# Patient Record
Sex: Male | Born: 1995 | Race: Black or African American | Hispanic: No | Marital: Single | Smoking: Current some day smoker
Health system: Southern US, Community
[De-identification: ages and names within clinical notes are randomized; demographics above are authoritative.]

---
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
## 2020-10-31 ENCOUNTER — Ambulatory Visit (HOSPITAL_COMMUNITY)
Admission: EM | Admit: 2020-10-31 | Discharge: 2020-10-31 | Disposition: A | Discharge status: Home / Self Care | Payer: Self-pay | Attending: Student | Admitting: Student

## 2020-10-31 ENCOUNTER — Other Ambulatory Visit: Payer: Self-pay

## 2020-10-31 ENCOUNTER — Encounter (HOSPITAL_COMMUNITY): Payer: Self-pay | Admitting: Emergency Medicine

## 2020-10-31 DIAGNOSIS — H00014 Hordeolum externum left upper eyelid: Secondary | ICD-10-CM

## 2020-10-31 MED ORDER — ERYTHROMYCIN 5 MG/GM OP OINT: TOPICAL_OINTMENT | OPHTHALMIC | 0 refills | Status: AC

## 2020-10-31 NOTE — ED Triage Notes (Addendum)
Reports feeling like something in left eye.  Eye is red, eyelids puffy.  Patient feels like it is irritated.  Intermittent blurry vision

## 2020-10-31 NOTE — ED Provider Notes (Signed)
[facility]    CSN: [id number] Arrival date & time: 10/31/20  1005      History   Chief Complaint Chief Complaint  Patient presents with   Eye Problem    HPI Daniel Lester is a 25 y.o. male presenting with L upper eyelid pain and swelling x2 days. History styes in the past, but typically on the bottom. Scant clear discharge in the morning. Doesn't wear contacts or glasses.  Denies photophobia, foreign body sensation, eye redness, eye pain, eye pain with movement, injury to eye, vision changes, double vision, excessive tearing, burning eyes   HPI  History reviewed. No pertinent past medical history.  There are no problems to display for this patient.   History reviewed. No pertinent surgical history.     Home Medications    Prior to Admission medications   Medication Sig Start Date End Date Taking? Authorizing Provider  erythromycin ophthalmic ointment Place a 1/2 inch ribbon of ointment into the lower eyelid at bedtime x7 days 10/31/20  Yes Rhys Martini, PA-C    Family History Family History  Problem Relation Age of Onset   Healthy Mother    Healthy Father     Social History Social History   Tobacco Use   Smoking status: Some Days    Types: Cigarettes   Smokeless tobacco: Never  Vaping Use   Vaping Use: Never used  Substance Use Topics   Alcohol use: Never   Drug use: Yes    Types: Marijuana     Allergies   Patient has no known allergies.   Review of Systems Review of Systems  Eyes:  Positive for discharge. Negative for photophobia, pain, redness, itching and visual disturbance.  All other systems reviewed and are negative.   Physical Exam Triage Vital Signs ED Triage Vitals  Enc Vitals Group     BP 10/31/20 1023 125/75     Pulse Rate 10/31/20 1023 78     Resp 10/31/20 1023 18     Temp 10/31/20 1023 98.6 F (37 C)     Temp Source 10/31/20 1023 Oral     SpO2 10/31/20 1023 99 %     Weight --      Height --      Head  Circumference --      Peak Flow --      Pain Score 10/31/20 1020 5     Pain Loc --      Pain Edu? --      Excl. in GC? --    No data found.  Updated Vital Signs BP 125/75 (BP Location: Right Arm)   Pulse 78   Temp 98.6 F (37 C) (Oral)   Resp 18   SpO2 99%   Visual Acuity Right Eye Distance: 20/20 Left Eye Distance: 20/20 Bilateral Distance: 20/20  Right Eye Near:   Left Eye Near:    Bilateral Near:     Physical Exam Vitals reviewed.  Constitutional:      Appearance: Normal appearance.  HENT:     Head: Normocephalic and atraumatic.     Right Ear: Tympanic membrane, ear canal and external ear normal. There is no impacted cerumen.     Left Ear: Tympanic membrane, ear canal and external ear normal. There is no impacted cerumen.     Nose: Nose normal. No congestion.     Mouth/Throat:     Pharynx: Oropharynx is clear. No posterior oropharyngeal erythema.  Eyes:     General: Lids  are everted, no foreign bodies appreciated. Vision grossly intact. Gaze aligned appropriately. No visual field deficit.       Right eye: No foreign body, discharge or hordeolum.        Left eye: No foreign body, discharge or hordeolum.     Extraocular Movements: Extraocular movements intact.     Right eye: Normal extraocular motion and no nystagmus.     Left eye: Normal extraocular motion and no nystagmus.     Conjunctiva/sclera: Conjunctivae normal.     Right eye: Right conjunctiva is not injected. No chemosis, exudate or hemorrhage.    Left eye: Left conjunctiva is not injected. No chemosis, exudate or hemorrhage.    Pupils: Pupils are equal, round, and reactive to light.     Visual Fields: Right eye visual fields normal and left eye visual fields normal.     Comments: Hordeolum left upper inner lid, minimally tender. No erythema warmth discharge. PERRLA, EOMI without pain. No orbital tenderness. Visual acuity intact.   Cardiovascular:     Rate and Rhythm: Normal rate and regular rhythm.      Heart sounds: Normal heart sounds.  Pulmonary:     Effort: Pulmonary effort is normal.     Breath sounds: Normal breath sounds.  Neurological:     General: No focal deficit present.     Mental Status: He is alert.  Psychiatric:        Mood and Affect: Mood normal.        Behavior: Behavior normal.        Thought Content: Thought content normal.        Judgment: Judgment normal.     UC Treatments / Results  Labs (all labs ordered are listed, but only abnormal results are displayed) Labs Reviewed - No data to display  EKG   Radiology No results found.  Procedures Procedures (including critical care time)  Medications Ordered in UC Medications - No data to display  Initial Impression / Assessment and Plan / UC Course  I have reviewed the triage vital signs and the nursing notes.  Pertinent labs & imaging results that were available during my care of the patient were reviewed by me and considered in my medical decision making (see chart for details).     This patient is a very pleasant 25 y.o. year old male presenting with hordeolum L upper lid. Visual acuity intact. Erythromycin ointment, warm compresses. ED return precautions discussed. Patient verbalizes understanding and agreement.  .   Final Clinical Impressions(s) / UC Diagnoses   Final diagnoses:  Hordeolum externum left upper eyelid     Discharge Instructions      -You have a stye (hordeolum). This is an inflamed oil gland noted on the margin of the eyelid at the level of the eyelashes.  -We are treating it with an antibiotic ointment called erythromycin.  Use this once nightly for about 7 days.  Pull down the lower eyelid, and place about half an inch inside.  This will be messy, so press the remaining ointment around the eye.  You can wash your face with gentle soap and water in the morning to wash off any remaining ointment. -Warm compresses -Seek additional medical attention if symptoms get worse, like  eye pain, eye lid swelling, vision changes. Follow-up with your eye doctor if possible, but we're also happy to see you!      ED Prescriptions     Medication Sig Dispense Auth. Provider   erythromycin ophthalmic ointment  Place a 1/2 inch ribbon of ointment into the lower eyelid at bedtime x7 days 3.5 g Rhys Martini, PA-C      PDMP not reviewed this encounter.   Rhys Martini, PA-C 10/31/20 1134

## 2020-10-31 NOTE — Discharge Instructions (Addendum)
-  You have a stye (hordeolum). This is an inflamed oil gland noted on the margin of the eyelid at the level of the eyelashes.  -We are treating it with an antibiotic ointment called erythromycin.  Use this once nightly for about 7 days.  Pull down the lower eyelid, and place about half an inch inside.  This will be messy, so press the remaining ointment around the eye.  You can wash your face with gentle soap and water in the morning to wash off any remaining ointment. -Warm compresses -Seek additional medical attention if symptoms get worse, like eye pain, eye lid swelling, vision changes. Follow-up with your eye doctor if possible, but we're also happy to see you! 

## 2021-02-20 ENCOUNTER — Other Ambulatory Visit: Payer: Self-pay

## 2021-02-20 ENCOUNTER — Emergency Department (HOSPITAL_COMMUNITY): Payer: BC Managed Care – PPO

## 2021-02-20 ENCOUNTER — Emergency Department (HOSPITAL_COMMUNITY)
Admission: EM | Admit: 2021-02-20 | Discharge: 2021-02-20 | Disposition: A | Discharge status: Home / Self Care | Payer: BC Managed Care – PPO | Attending: Emergency Medicine | Admitting: Emergency Medicine

## 2021-02-20 DIAGNOSIS — K6289 Other specified diseases of anus and rectum: Secondary | ICD-10-CM | POA: Diagnosis present

## 2021-02-20 DIAGNOSIS — Z20822 Contact with and (suspected) exposure to covid-19: Secondary | ICD-10-CM | POA: Diagnosis not present

## 2021-02-20 DIAGNOSIS — Z79899 Other long term (current) drug therapy: Secondary | ICD-10-CM | POA: Diagnosis not present

## 2021-02-20 DIAGNOSIS — F17210 Nicotine dependence, cigarettes, uncomplicated: Secondary | ICD-10-CM | POA: Diagnosis not present

## 2021-02-20 DIAGNOSIS — K5641 Fecal impaction: Secondary | ICD-10-CM | POA: Insufficient documentation

## 2021-02-20 DIAGNOSIS — K5900 Constipation, unspecified: Secondary | ICD-10-CM

## 2021-02-20 LAB — CBC WITH DIFFERENTIAL/PLATELET
Abs Immature Granulocytes: 0.1 10*3/uL — ABNORMAL HIGH (ref 0.00–0.07)
Basophils Absolute: 0 10*3/uL (ref 0.0–0.1)
Basophils Relative: 0 %
Eosinophils Absolute: 0 10*3/uL (ref 0.0–0.5)
Eosinophils Relative: 0 %
HCT: 49.9 % (ref 39.0–52.0)
Hemoglobin: 16.2 g/dL (ref 13.0–17.0)
Immature Granulocytes: 1 %
Lymphocytes Relative: 5 %
Lymphs Abs: 1 10*3/uL (ref 0.7–4.0)
MCH: 27.8 pg (ref 26.0–34.0)
MCHC: 32.5 g/dL (ref 30.0–36.0)
MCV: 85.7 fL (ref 80.0–100.0)
Monocytes Absolute: 0.6 10*3/uL (ref 0.1–1.0)
Monocytes Relative: 3 %
Neutro Abs: 18.6 10*3/uL — ABNORMAL HIGH (ref 1.7–7.7)
Neutrophils Relative %: 91 %
Platelets: 243 10*3/uL (ref 150–400)
RBC: 5.82 MIL/uL — ABNORMAL HIGH (ref 4.22–5.81)
RDW: 13.2 % (ref 11.5–15.5)
WBC: 20.4 10*3/uL — ABNORMAL HIGH (ref 4.0–10.5)
nRBC: 0 % (ref 0.0–0.2)

## 2021-02-20 LAB — BASIC METABOLIC PANEL
Anion gap: 12 (ref 5–15)
BUN: 11 mg/dL (ref 6–20)
CO2: 22 mmol/L (ref 22–32)
Calcium: 10 mg/dL (ref 8.9–10.3)
Chloride: 106 mmol/L (ref 98–111)
Creatinine, Ser: 0.8 mg/dL (ref 0.61–1.24)
GFR, Estimated: >60 mL/min (ref >60)
Glucose, Bld: 127 mg/dL — ABNORMAL HIGH (ref 70–99)
Potassium: 3.7 mmol/L (ref 3.5–5.1)
Sodium: 140 mmol/L (ref 135–145)

## 2021-02-20 LAB — LIPASE, BLOOD: Lipase: 24 U/L (ref 11–51)

## 2021-02-20 LAB — HEPATIC FUNCTION PANEL
ALT: 24 U/L (ref 0–44)
AST: 26 U/L (ref 15–41)
Albumin: 5 g/dL (ref 3.5–5.0)
Alkaline Phosphatase: 28 U/L — ABNORMAL LOW (ref 38–126)
Bilirubin, Direct: 0.1 mg/dL (ref 0.0–0.2)
Indirect Bilirubin: 0.6 mg/dL (ref 0.3–0.9)
Total Bilirubin: 0.7 mg/dL (ref 0.3–1.2)
Total Protein: 8.1 g/dL (ref 6.5–8.1)

## 2021-02-20 LAB — CBG MONITORING, ED: Glucose-Capillary: 119 mg/dL — ABNORMAL HIGH (ref 70–99)

## 2021-02-20 LAB — LACTIC ACID, PLASMA
Lactic Acid, Venous: 2.2 mmol/L (ref 0.5–1.9)
Lactic Acid, Venous: 1.5 mmol/L (ref 0.5–1.9)

## 2021-02-20 LAB — RESP PANEL BY RT-PCR (FLU A&B, COVID) ARPGX2
Influenza A by PCR: NEGATIVE
Influenza B by PCR: NEGATIVE
SARS Coronavirus 2 by RT PCR: NEGATIVE

## 2021-02-20 MED ORDER — IOHEXOL 300 MG/ML  SOLN
100.0000 mL | Freq: Once | INTRAMUSCULAR | Status: AC | PRN
  Administered 2021-02-20: 100 mL via INTRAVENOUS

## 2021-02-20 MED ORDER — POLYETHYLENE GLYCOL 3350 17 GM/SCOOP PO POWD: 17.0000 g | Freq: Two times a day (BID) | ORAL | 0 refills | Status: AC

## 2021-02-20 MED ORDER — KETOROLAC TROMETHAMINE 15 MG/ML IJ SOLN
15.0000 mg | Freq: Once | INTRAMUSCULAR | Status: AC
  Administered 2021-02-20: 15 mg via INTRAVENOUS
  Filled 2021-02-20: qty 1

## 2021-02-20 MED ORDER — MAGNESIUM HYDROXIDE 400 MG/5ML PO SUSP
960.0000 mL | Freq: Once | ORAL | Status: AC
  Administered 2021-02-20: 960 mL via RECTAL
  Filled 2021-02-20: qty 473

## 2021-02-20 NOTE — ED Notes (Addendum)
Pt was able to get 1/4 of enema bag. Pt stated he can feel the blockage moving but still unable to pass through rectum. Informed pt the goal is to have the enema sit in rectum for as long as he can to soften stool.

## 2021-02-20 NOTE — ED Triage Notes (Addendum)
While taking blood work pt became diaphoretic followed by chills. HR 120s, BP 96/64. PA at bedside for reassessment. Pt noted to have tenderness to lower abdominal pain. Symptoms resolved. Increased acuity level.

## 2021-02-20 NOTE — ED Notes (Signed)
Pt stated he was able to pass his stool and feel relief.

## 2021-02-20 NOTE — ED Notes (Signed)
RN was only able to administer 1/4 of enema before pt stated he could not hold product any more. Encouraged pt to hold as long as possible to soften stool.

## 2021-02-20 NOTE — ED Notes (Signed)
Patient transported to CT 

## 2021-02-20 NOTE — ED Notes (Signed)
While trying to administer remaining enema pt was unable to continue hold product and it started to leak. Pt states he can feel it but it just won't pass through. Will notify EDP

## 2021-02-20 NOTE — ED Triage Notes (Addendum)
Pt c/o rectal pain onset yesterday. Took laxative yesterday at 11 pm, no BM since, and no improvement of pain. Endorses constipation. Denies abdominal pain, NV, and rectal bleeding.   Initial check in complain of leg pain. Denies any leg pain.

## 2021-02-20 NOTE — ED Provider Notes (Signed)
[facility] EMERGENCY DEPARTMENT Provider Note   CSN: [id number] Arrival date & time: 02/20/21  4098     History Chief Complaint  Patient presents with   Rectal Pain    Daniel Lester is a 25 y.o. male.  HPI  Patient presents ED with complaints of rectal pain and constipation that started in the last day or 2..  Patient states he has felt constipated recently.  He feels like he has to go to the bathroom but has only gone a small amount.  He still feels like there is stool in his rectum.  He tried over-the-counter medications without relief.  He is getting bouts of pain in the rectal area.  When this occurs he feels diaphoretic and sweaty.  No past medical history on file.  There are no problems to display for this patient.   No past surgical history on file.     Family History  Problem Relation Age of Onset   Healthy Mother    Healthy Father     Social History   Tobacco Use   Smoking status: Some Days    Types: Cigarettes   Smokeless tobacco: Never  Vaping Use   Vaping Use: Never used  Substance Use Topics   Alcohol use: Never   Drug use: Yes    Types: Marijuana    Home Medications Prior to Admission medications   Medication Sig Start Date End Date Taking? Authorizing Provider  polyethylene glycol powder (MIRALAX) 17 GM/SCOOP powder Take 17 g by mouth in the morning and at bedtime. 02/20/21  Yes Linwood Dibbles, MD  erythromycin ophthalmic ointment Place a 1/2 inch ribbon of ointment into the lower eyelid at bedtime x7 days 10/31/20   Rhys Martini, PA-C    Allergies    Patient has no known allergies.  Review of Systems   Review of Systems  Constitutional:  Negative for fever.  Gastrointestinal:  Negative for abdominal pain.  All other systems reviewed and are negative.  Physical Exam Updated Vital Signs BP 125/76    Pulse 78    Temp 99.2 F (37.3 C) (Oral)    Resp 20    SpO2 99%   Physical Exam Vitals and nursing note reviewed.   Constitutional:      General: He is not in acute distress.    Appearance: He is well-developed.  HENT:     Head: Normocephalic and atraumatic.     Right Ear: External ear normal.     Left Ear: External ear normal.  Eyes:     General: No scleral icterus.       Right eye: No discharge.        Left eye: No discharge.     Conjunctiva/sclera: Conjunctivae normal.  Neck:     Trachea: No tracheal deviation.  Cardiovascular:     Rate and Rhythm: Normal rate and regular rhythm.  Pulmonary:     Effort: Pulmonary effort is normal. No respiratory distress.     Breath sounds: Normal breath sounds. No stridor. No wheezing or rales.  Abdominal:     General: Bowel sounds are normal. There is no distension.     Palpations: Abdomen is soft.     Tenderness: There is no abdominal tenderness. There is no guarding or rebound.  Genitourinary:    Comments: Large firm stool ball in the rectal vault, no mass, no blood noted on exam, no areas of induration or fluctuance Musculoskeletal:        General:  No tenderness or deformity.     Cervical back: Neck supple.  Skin:    General: Skin is warm and dry.     Findings: No rash.  Neurological:     General: No focal deficit present.     Mental Status: He is alert.     Cranial Nerves: No cranial nerve deficit (no facial droop, extraocular movements intact, no slurred speech).     Sensory: No sensory deficit.     Motor: No abnormal muscle tone or seizure activity.     Coordination: Coordination normal.  Psychiatric:        Mood and Affect: Mood normal.    ED Results / Procedures / Treatments   Labs (all labs ordered are listed, but only abnormal results are displayed) Labs Reviewed  CBC WITH DIFFERENTIAL/PLATELET - Abnormal; Notable for the following components:      Result Value   WBC 20.4 (*)    RBC 5.82 (*)    Neutro Abs 18.6 (*)    Abs Immature Granulocytes 0.10 (*)    All other components within normal limits  BASIC METABOLIC PANEL -  Abnormal; Notable for the following components:   Glucose, Bld 127 (*)    All other components within normal limits  HEPATIC FUNCTION PANEL - Abnormal; Notable for the following components:   Alkaline Phosphatase 28 (*)    All other components within normal limits  LACTIC ACID, PLASMA - Abnormal; Notable for the following components:   Lactic Acid, Venous 2.2 (*)    All other components within normal limits  CBG MONITORING, ED - Abnormal; Notable for the following components:   Glucose-Capillary 119 (*)    All other components within normal limits  RESP PANEL BY RT-PCR (FLU A&B, COVID) ARPGX2  CULTURE, BLOOD (ROUTINE X 2)  CULTURE, BLOOD (ROUTINE X 2)  LIPASE, BLOOD  LACTIC ACID, PLASMA    EKG None  Radiology DG Abdomen 1 View  Result Date: 02/20/2021 CLINICAL DATA:  Constipation. EXAM: ABDOMEN - 1 VIEW COMPARISON:  5/24/6 FINDINGS: The bowel gas pattern appears nonobstructed. Moderate stool burden identified within the colon. Large desiccated stool ball is noted within the rectum with a diameter of 6.4 cm. No radiopaque foreign body or abnormal abdominal calcifications. No signs of free air. IMPRESSION: 1. Nonobstructive bowel gas pattern. 2. Moderate stool burden within the colon with desiccated stool ball in the rectum. Correlate for any clinical signs or symptoms of constipation. Electronically Signed   By: Kerby Moors M.D.   On: 02/20/2021 08:11   CT ABDOMEN PELVIS W CONTRAST  Result Date: 02/20/2021 CLINICAL DATA:  Left lower quadrant abdominal pain. EXAM: CT ABDOMEN AND PELVIS WITH CONTRAST TECHNIQUE: Multidetector CT imaging of the abdomen and pelvis was performed using the standard protocol following bolus administration of intravenous contrast. CONTRAST:  126mL OMNIPAQUE IOHEXOL 300 MG/ML  SOLN COMPARISON:  None. FINDINGS: Lower chest: No acute abnormality. Hepatobiliary: No focal liver abnormality is seen. No gallstones, gallbladder wall thickening, or biliary dilatation.  Pancreas: Unremarkable. No pancreatic ductal dilatation or surrounding inflammatory changes. Spleen: Normal in size without focal abnormality. Adrenals/Urinary Tract: Adrenal glands are unremarkable. Kidneys are normal, without renal calculi, focal lesion, or hydronephrosis. Bladder is unremarkable. Stomach/Bowel: The stomach appears normal. No small bowel dilatation is noted. Stool is noted throughout the colon, with a large amount of stool noted in the distal sigmoid colon and rectum concerning for impaction. No definite inflammation is noted. Appendix is not clearly visualized. Vascular/Lymphatic: No significant vascular findings are  present. No enlarged abdominal or pelvic lymph nodes. Reproductive: Prostate is unremarkable. Other: No abdominal wall hernia or abnormality. No abdominopelvic ascites. Musculoskeletal: No acute or significant osseous findings. IMPRESSION: Stool is noted throughout the colon, with a large amount of stool noted in the distal sigmoid colon and rectum concerning for impaction. No other abnormality seen in the abdomen or pelvis. Electronically Signed   By: Marijo Conception M.D.   On: 02/20/2021 12:48    Procedures Procedures   Medications Ordered in ED Medications  ketorolac (TORADOL) 15 MG/ML injection 15 mg (15 mg Intravenous Given 02/20/21 1240)  sorbitol, milk of mag, mineral oil, glycerin (SMOG) enema (960 mLs Rectal Given 02/20/21 1310)  iohexol (OMNIPAQUE) 300 MG/ML solution 100 mL (100 mLs Intravenous Contrast Given 02/20/21 1204)    ED Course  I have reviewed the triage vital signs and the nursing notes.  Pertinent labs & imaging results that were available during my care of the patient were reviewed by me and considered in my medical decision making (see chart for details).  Clinical Course as of 02/20/21 1531  Sat Feb 20, 2021  1131 Abdominal film shows large stool ball [JK]  1131 CBC shows leukocytosis electrolyte panel unremarkable [JK]  1342 CT scan  consistent with possible fecal impaction.  No other abnormality noted.  No signs of colitis or diverticulitis, no rectal abscess [JK]    Clinical Course User Index [JK] Dorie Rank, MD   MDM Rules/Calculators/A&P                         Patient presented with complaints of rectal pain.  Labs notable for significant leukocytosis.  Concern for the possibility of abscess versus stercoral colitis.  Patient did have fecal impaction notable on exam.  CT scan was performed and it does not show any evidence of colitis but does show fecal impaction.  Patient was given an enema and had large bowel movement in the ED with significant improvement.    Final Clinical Impression(s) / ED Diagnoses Final diagnoses:  Constipation, unspecified constipation type  Fecal impaction ([place])    Rx / DC Orders ED Discharge Orders          Ordered    polyethylene glycol powder (MIRALAX) 17 GM/SCOOP powder  2 times daily        02/20/21 1531             Dorie Rank, MD 02/20/21 [phone]

## 2021-02-20 NOTE — Discharge Instructions (Signed)
Take medication as prescribed to help keep your stool soft.  You can also use over-the-counter enemas as needed.  Follow-up with your primary care doctor to be rechecked

## 2021-02-20 NOTE — ED Provider Notes (Addendum)
Emergency Medicine Provider Triage Evaluation Note  Daniel Lester , a 25 y.o. male  was evaluated in triage.  Pt complains of rectal pain and constipation. Tried taking otc laxatives earlier today w/o relief. He further reports sweats/chills  Review of Systems  Positive: Constipation, rectal bleeding Negative: Abd pain, nv  Physical Exam  BP 119/72    Pulse 91    Temp 98.7 F (37.1 C) (Oral)    Resp 17    SpO2 99%  Gen:   Awake, no distress   Resp:  Normal effort  MSK:   Moves extremities without difficulty  Abd exam: Mild TTP to the LLQ  Medical Decision Making  Medically screening exam initiated at 6:59 AM.  Appropriate orders placed.  Vince L Seago was informed that the remainder of the evaluation will be completed by another provider, this initial triage assessment does not replace that evaluation, and the importance of remaining in the ED until their evaluation is complete.     Karrie Meres, PA-C 02/20/21 2703    Karrie Meres, PA-C 02/20/21 5009    Geoffery Lyons, MD 02/21/21 [phone]

## 2021-02-25 LAB — CULTURE, BLOOD (ROUTINE X 2)
Culture: NO GROWTH
Culture: NO GROWTH
Special Requests: ADEQUATE

## 2022-05-15 IMAGING — DX DG ABDOMEN 1V
2 series · 2 of 2 positions shown · non-contrast
Comparison: [DATE]

CLINICAL DATA: Constipation.

EXAM:
ABDOMEN - 1 VIEW

[t abdomen supine (1 of 2)]
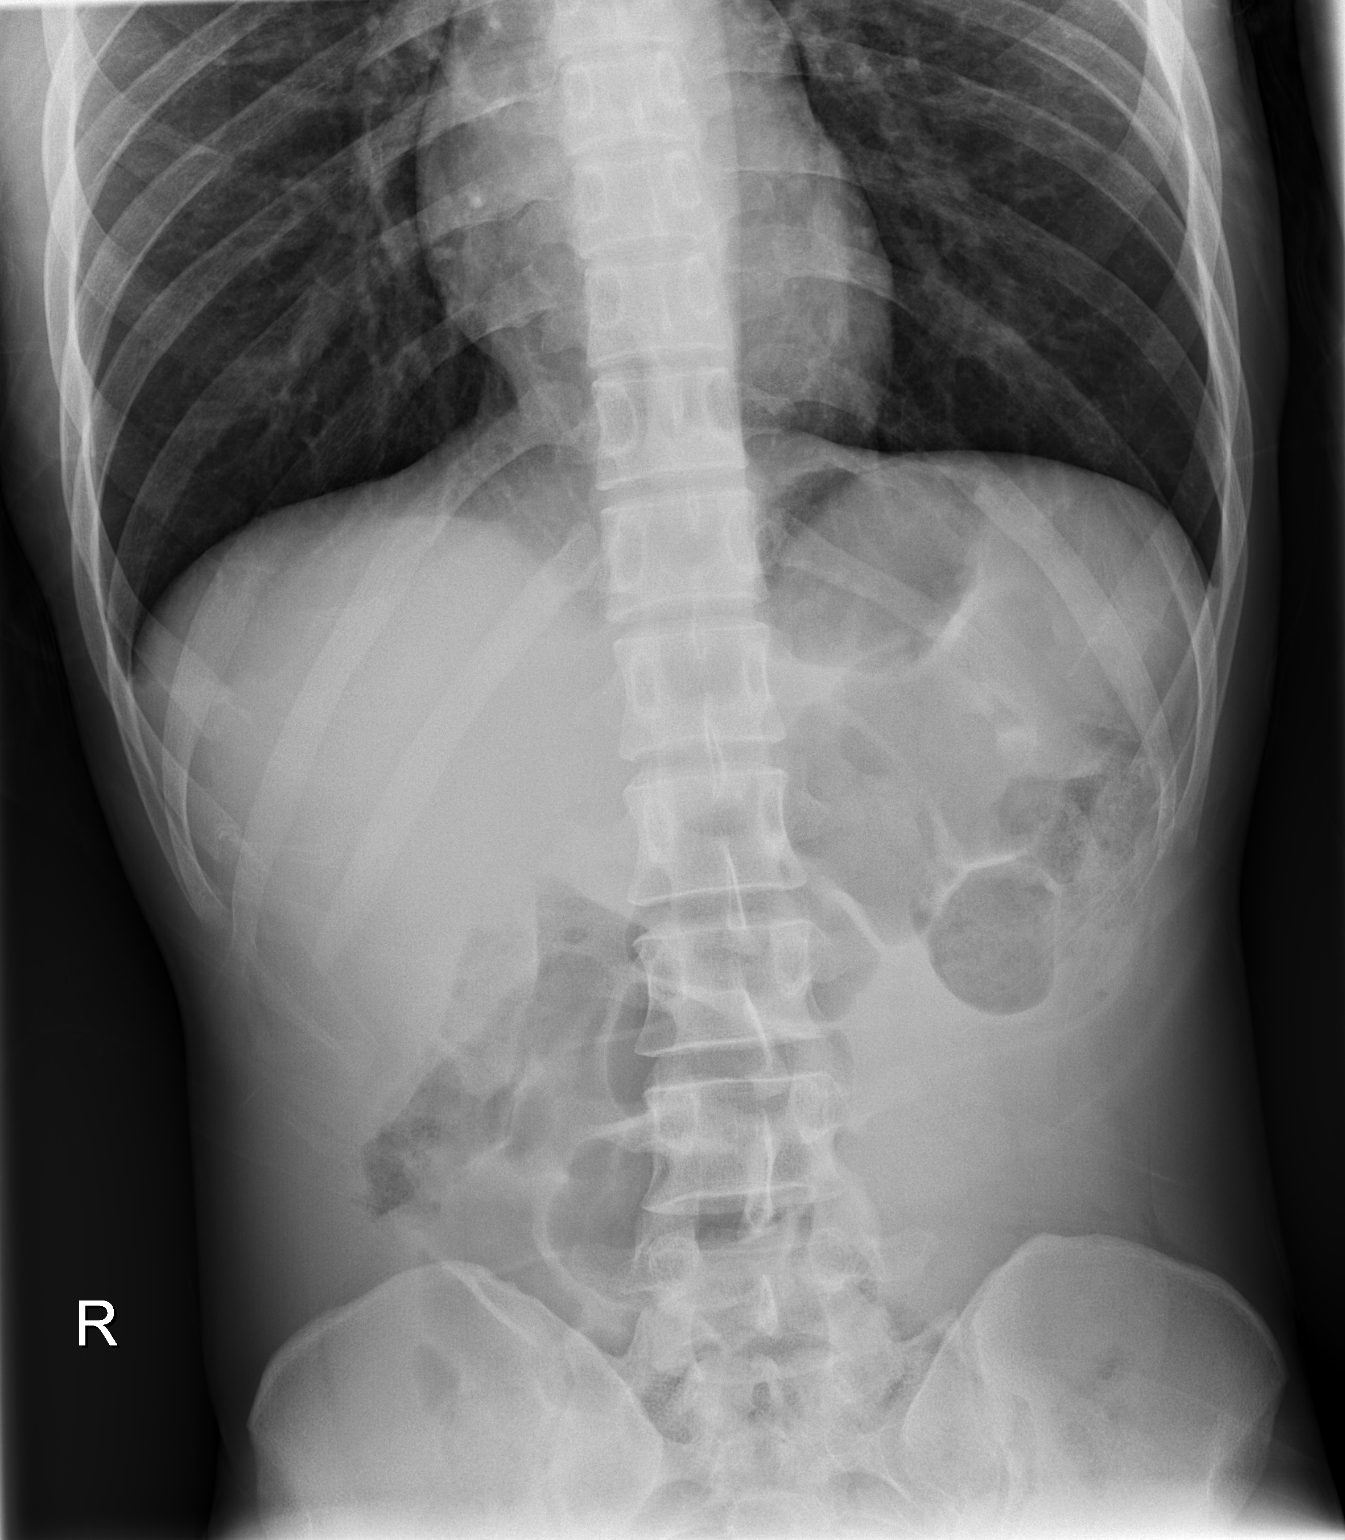

[t abdomen supine (2 of 2)]
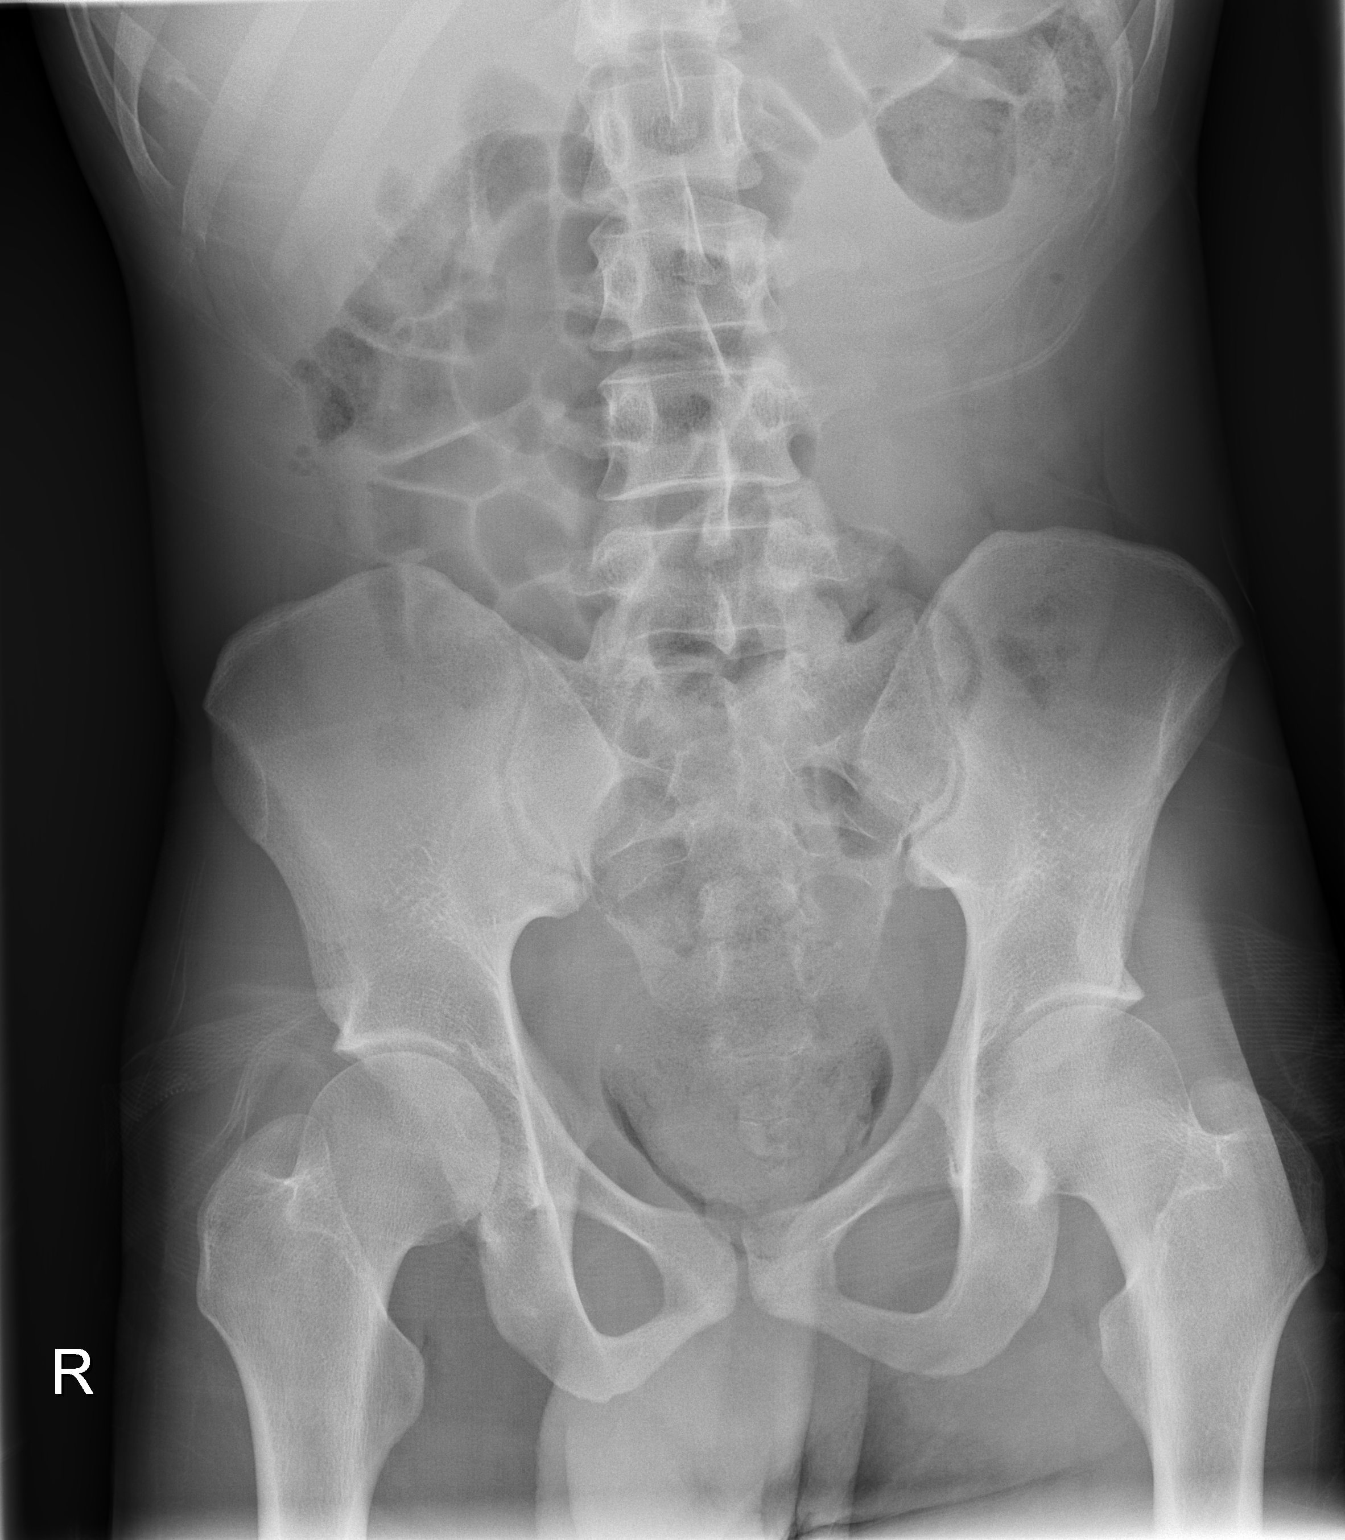

[2 of 2 positions shown; findings below may reference images not displayed]

FINDINGS: The bowel gas pattern appears nonobstructed. Moderate stool burden
identified within the colon. Large desiccated stool ball is noted
within the rectum with a diameter of 6.4 cm. No radiopaque foreign
body or abnormal abdominal calcifications. No signs of free air.
IMPRESSION: 1. Nonobstructive bowel gas pattern.
2. Moderate stool burden within the colon with desiccated stool ball
in the rectum. Correlate for any clinical signs or symptoms of
constipation.

## 2023-04-23 DIAGNOSIS — Z202 Contact with and (suspected) exposure to infections with a predominantly sexual mode of transmission: Secondary | ICD-10-CM | POA: Diagnosis not present

## 2023-04-23 DIAGNOSIS — A64 Unspecified sexually transmitted disease: Secondary | ICD-10-CM | POA: Diagnosis not present

## 2023-06-06 ENCOUNTER — Encounter: Payer: Self-pay | Admitting: Emergency Medicine

## 2023-06-06 ENCOUNTER — Ambulatory Visit
Admission: EM | Admit: 2023-06-06 | Discharge: 2023-06-06 | Disposition: A | Discharge status: Home / Self Care | Attending: Family Medicine | Admitting: Family Medicine

## 2023-06-06 DIAGNOSIS — L02213 Cutaneous abscess of chest wall: Secondary | ICD-10-CM | POA: Diagnosis not present

## 2023-06-06 MED ORDER — SULFAMETHOXAZOLE-TRIMETHOPRIM 800-160 MG PO TABS: 1.0000 | ORAL_TABLET | Freq: Two times a day (BID) | ORAL | 0 refills | Status: AC

## 2023-06-06 NOTE — ED Provider Notes (Signed)
 [facility]    CSN: [id number] Arrival date & time: 06/06/23  0805      History   Chief Complaint Chief Complaint  Patient presents with   Abscess    HPI Daniel Lester is a 28 y.o. male.    Abscess Presents today with a concern for a abscess forming on his chest wall immediately above his left nipple.  Patient endorses that he noticed the abscess days ago and touched it and noticed it was slightly tender.  The tenderness has increased over the last few days and abscess has not resolved.  He is in for evaluation.  Has no history of recurrent abscess formation.  Abscess has not drained.  History reviewed. No pertinent past medical history.  There are no active problems to display for this patient.   History reviewed. No pertinent surgical history.     Home Medications    Prior to Admission medications   Medication Sig Start Date End Date Taking? Authorizing Provider  sulfamethoxazole-trimethoprim (BACTRIM DS) 800-160 MG tablet Take 1 tablet by mouth 2 (two) times daily. 06/06/23  Yes Bing Neighbors, NP  erythromycin ophthalmic ointment Place a 1/2 inch ribbon of ointment into the lower eyelid at bedtime x7 days 10/31/20   Rhys Martini, PA-C  polyethylene glycol powder (MIRALAX) 17 GM/SCOOP powder Take 17 g by mouth in the morning and at bedtime. 02/20/21   Linwood Dibbles, MD    Family History Family History  Problem Relation Age of Onset   Healthy Mother    Healthy Father     Social History Social History   Tobacco Use   Smoking status: Some Days    Types: Cigarettes    Passive exposure: Never   Smokeless tobacco: Never  Vaping Use   Vaping status: Never Used  Substance Use Topics   Alcohol use: Never   Drug use: Yes    Types: Marijuana     Allergies   Patient has no known allergies.   Review of Systems Review of Systems Pertinent negatives listed in HPI   Physical Exam Triage Vital Signs ED Triage Vitals  Encounter Vitals  Group     BP 06/06/23 0829 118/68     Systolic BP Percentile --      Diastolic BP Percentile --      Pulse Rate 06/06/23 0829 66     Resp 06/06/23 0829 16     Temp 06/06/23 0829 98 F (36.7 C)     Temp Source 06/06/23 0829 Oral     SpO2 06/06/23 0829 97 %     Weight 06/06/23 0827 120 lb (54.4 kg)     Height --      Head Circumference --      Peak Flow --      Pain Score 06/06/23 0827 6     Pain Loc --      Pain Education --      Exclude from Growth Chart --    No data found.  Updated Vital Signs BP 118/68 (BP Location: Left Arm)   Pulse 66   Temp 98 F (36.7 C) (Oral)   Resp 16   Wt 120 lb (54.4 kg)   SpO2 97%   Visual Acuity Right Eye Distance:   Left Eye Distance:   Bilateral Distance:    Right Eye Near:   Left Eye Near:    Bilateral Near:     Physical Exam Vitals reviewed.  Constitutional:  Appearance: Normal appearance.  HENT:     Head: Normocephalic and atraumatic.  Eyes:     Extraocular Movements: Extraocular movements intact.     Pupils: Pupils are equal, round, and reactive to light.  Cardiovascular:     Rate and Rhythm: Normal rate and regular rhythm.  Pulmonary:     Effort: Pulmonary effort is normal.     Breath sounds: Normal breath sounds.  Chest:    Neurological:     Mental Status: He is alert.      UC Treatments / Results  Labs (all labs ordered are listed, but only abnormal results are displayed) Labs Reviewed - No data to display  EKG   Radiology No results found.  Procedures Procedures (including critical care time)  Medications Ordered in UC Medications - No data to display  Initial Impression / Assessment and Plan / UC Course  I have reviewed the triage vital signs and the nursing notes.  Pertinent labs & imaging results that were available during my care of the patient were reviewed by me and considered in my medical decision making (see chart for details).    Abscess of the left chest wall, I&D not indicated  will trial treatment with antibiotics.  Bactrim twice daily for 10 days along with warm compresses.  Return precautions given if symptoms do not resolve.  Verbalized understanding and agreement with plan. Final Clinical Impressions(s) / UC Diagnoses   Final diagnoses:  Cutaneous abscess of chest wall     Discharge Instructions      Start antibiotics take twice daily for 10 days.  Also recommend applying warm compresses to the abscess swelling.  If abscess is not completely resolved within 2 weeks return for evaluation.  If abscess becomes more enlarged or painful after completing antibiotics return sooner.   ED Prescriptions     Medication Sig Dispense Auth. Provider   sulfamethoxazole-trimethoprim (BACTRIM DS) 800-160 MG tablet Take 1 tablet by mouth 2 (two) times daily. 20 tablet Buena Carmine, NP      PDMP not reviewed this encounter.   Buena Carmine, NP 06/06/23 [phone]

## 2023-06-06 NOTE — ED Triage Notes (Signed)
 Pt presents with abscess on left side of chest near his nipple. Pt says he first noticed it was there on last Sunday. Only painful to touch.

## 2023-06-06 NOTE — Discharge Instructions (Signed)
 Start antibiotics take twice daily for 10 days.  Also recommend applying warm compresses to the abscess swelling.  If abscess is not completely resolved within 2 weeks return for evaluation.  If abscess becomes more enlarged or painful after completing antibiotics return sooner.

## 2023-07-11 ENCOUNTER — Ambulatory Visit: Admitting: Family

## 2023-07-12 ENCOUNTER — Encounter: Payer: Self-pay | Admitting: Family

## 2023-07-12 ENCOUNTER — Ambulatory Visit (INDEPENDENT_AMBULATORY_CARE_PROVIDER_SITE_OTHER): Admitting: Family

## 2023-07-12 VITALS — BP 127/81 | HR 97

## 2023-07-12 DIAGNOSIS — Z0000 Encounter for general adult medical examination without abnormal findings: Secondary | ICD-10-CM

## 2023-07-12 DIAGNOSIS — Z131 Encounter for screening for diabetes mellitus: Secondary | ICD-10-CM | POA: Diagnosis not present

## 2023-07-12 DIAGNOSIS — Z130 Encounter for screening for diseases of the blood and blood-forming organs and certain disorders involving the immune mechanism: Secondary | ICD-10-CM

## 2023-07-12 DIAGNOSIS — Z13228 Encounter for screening for other metabolic disorders: Secondary | ICD-10-CM

## 2023-07-12 DIAGNOSIS — Z114 Encounter for screening for human immunodeficiency virus [HIV]: Secondary | ICD-10-CM | POA: Diagnosis not present

## 2023-07-12 DIAGNOSIS — Z1159 Encounter for screening for other viral diseases: Secondary | ICD-10-CM | POA: Diagnosis not present

## 2023-07-12 DIAGNOSIS — Z1329 Encounter for screening for other suspected endocrine disorder: Secondary | ICD-10-CM | POA: Diagnosis not present

## 2023-07-12 DIAGNOSIS — Z7689 Persons encountering health services in other specified circumstances: Secondary | ICD-10-CM

## 2023-07-12 DIAGNOSIS — Z13220 Encounter for screening for lipoid disorders: Secondary | ICD-10-CM | POA: Diagnosis not present

## 2023-07-12 NOTE — Progress Notes (Signed)
 Subjective:    Daniel Lester - 28 y.o. male MRN [id number]  Date of birth: Oct 01, 1995  HPI  Daniel Lester is to establish care and annual exam.  Current issues and/or concerns: - States feeling improved since recent Urgent Care visit. Denies red flag symptoms. - No further issues/concerns for discussion today.  ROS per HPI    Health Maintenance:  Health Maintenance Due  Topic Date Due   HPV VACCINES (1 - Male 3-dose series) Never done   HIV Screening  Never done   Hepatitis C Screening  Never done   DTaP/Tdap/Td (1 - Tdap) Never done   Pneumococcal Vaccine 79-44 Years old (1 of 2 - PCV) Never done   COVID-19 Vaccine (1 - 2024-25 season) Never done     Past Medical History: There are no active problems to display for this patient.     Social History   reports that he has been smoking cigarettes. He has never been exposed to tobacco smoke. He has never used smokeless tobacco. He reports that he does not currently use alcohol. He reports current drug use. Drug: Marijuana.   Family History  family history includes Diabetes in his maternal grandfather and maternal grandmother; Healthy in his father and mother.   Medications: reviewed and updated   Objective:   Physical Exam BP 127/81   Pulse 97   SpO2 98%   Physical Exam HENT:     Head: Normocephalic and atraumatic.     Right Ear: Tympanic membrane, ear canal and external ear normal.     Left Ear: Tympanic membrane, ear canal and external ear normal.     Nose: Nose normal.     Mouth/Throat:     Mouth: Mucous membranes are moist.     Pharynx: Oropharynx is clear.  Eyes:     Extraocular Movements: Extraocular movements intact.     Conjunctiva/sclera: Conjunctivae normal.     Pupils: Pupils are equal, round, and reactive to light.  Neck:     Thyroid: No thyroid mass, thyromegaly or thyroid tenderness.  Cardiovascular:     Rate and Rhythm: Normal rate and regular rhythm.     Pulses: Normal pulses.      Heart sounds: Normal heart sounds.  Pulmonary:     Effort: Pulmonary effort is normal.     Breath sounds: Normal breath sounds.  Abdominal:     General: Bowel sounds are normal.     Palpations: Abdomen is soft.  Genitourinary:    Comments: Patient declined. Musculoskeletal:        General: Normal range of motion.     Right shoulder: Normal.     Left shoulder: Normal.     Right upper arm: Normal.     Left upper arm: Normal.     Right elbow: Normal.     Left elbow: Normal.     Right forearm: Normal.     Left forearm: Normal.     Right wrist: Normal.     Left wrist: Normal.     Right hand: Normal.     Left hand: Normal.     Cervical back: Normal, normal range of motion and neck supple.     Thoracic back: Normal.     Lumbar back: Normal.     Right hip: Normal.     Left hip: Normal.     Right upper leg: Normal.     Left upper leg: Normal.     Right knee: Normal.     Left  knee: Normal.     Right lower leg: Normal.     Left lower leg: Normal.     Right ankle: Normal.     Left ankle: Normal.     Right foot: Normal.     Left foot: Normal.  Skin:    General: Skin is warm and dry.     Capillary Refill: Capillary refill takes less than 2 seconds.  Neurological:     General: No focal deficit present.     Mental Status: He is alert and oriented to person, place, and time.  Psychiatric:        Mood and Affect: Mood normal.        Behavior: Behavior normal.       Assessment & Plan:  1. Encounter to establish care (Primary) 2. Annual physical exam - Counseled on 150 minutes of exercise per week as tolerated, healthy eating (including decreased daily intake of saturated fats, cholesterol, added sugars, sodium), STI prevention, and routine healthcare maintenance.  3. Screening for metabolic disorder - Routine screening.  - CMP14+EGFR  4. Screening for deficiency anemia - Routine screening.  - CBC  5. Diabetes mellitus screening - Routine screening.  - Hemoglobin  A1c  6. Screening cholesterol level - Routine screening.  - Lipid panel  7. Thyroid disorder screen - Routine screening.  - TSH  8. Encounter for screening for HIV - Routine screening.  - HIV antibody (with reflex)  9. Need for hepatitis C screening test - Routine screening.  - Hepatitis C Antibody   Patient was given clear instructions to go to Emergency Department or return to medical center if symptoms don't improve, worsen, or new problems develop.The patient verbalized understanding.  I discussed the assessment and treatment plan with the patient. The patient was provided an opportunity to ask questions and all were answered. The patient agreed with the plan and demonstrated an understanding of the instructions.   The patient was advised to call back or seek an in-person evaluation if the symptoms worsen or if the condition fails to improve as anticipated.    Lavona Pounds, NP 07/12/2023, 3:04 PM Primary Care at [facility]

## 2023-07-13 LAB — CMP14+EGFR
ALT: 12 IU/L (ref 0–44)
AST: 16 IU/L (ref 0–40)
Albumin: 4.9 g/dL (ref 4.3–5.2)
Alkaline Phosphatase: 29 IU/L — ABNORMAL LOW (ref 44–121)
BUN/Creatinine Ratio: 14 (ref 9–20)
BUN: 12 mg/dL (ref 6–20)
Bilirubin Total: 0.3 mg/dL (ref 0.0–1.2)
CO2: 20 mmol/L (ref 20–29)
Calcium: 9.6 mg/dL (ref 8.7–10.2)
Chloride: 104 mmol/L (ref 96–106)
Creatinine, Ser: 0.88 mg/dL (ref 0.76–1.27)
Globulin, Total: 2.1 g/dL (ref 1.5–4.5)
Glucose: 82 mg/dL (ref 70–99)
Potassium: 4.3 mmol/L (ref 3.5–5.2)
Sodium: 140 mmol/L (ref 134–144)
Total Protein: 7 g/dL (ref 6.0–8.5)
eGFR: 122 mL/min/{1.73_m2} (ref >59)

## 2023-07-13 LAB — CBC
Hematocrit: 44.9 % (ref 37.5–51.0)
Hemoglobin: 14.7 g/dL (ref 13.0–17.7)
MCH: 28.4 pg (ref 26.6–33.0)
MCHC: 32.7 g/dL (ref 31.5–35.7)
MCV: 87 fL (ref 79–97)
Platelets: 264 10*3/uL (ref 150–450)
RBC: 5.18 x10E6/uL (ref 4.14–5.80)
RDW: 12.6 % (ref 11.6–15.4)
WBC: 11.3 10*3/uL — ABNORMAL HIGH (ref 3.4–10.8)

## 2023-07-13 LAB — HIV ANTIBODY (ROUTINE TESTING W REFLEX): HIV Screen 4th Generation wRfx: NONREACTIVE

## 2023-07-13 LAB — HEMOGLOBIN A1C
Est. average glucose Bld gHb Est-mCnc: 111 mg/dL
Hgb A1c MFr Bld: 5.5 % (ref 4.8–5.6)

## 2023-07-13 LAB — LIPID PANEL
Chol/HDL Ratio: 3 ratio (ref 0.0–5.0)
Cholesterol, Total: 187 mg/dL (ref 100–199)
HDL: 63 mg/dL (ref >39)
LDL Chol Calc (NIH): 115 mg/dL — ABNORMAL HIGH (ref 0–99)
Triglycerides: 48 mg/dL (ref 0–149)
VLDL Cholesterol Cal: 9 mg/dL (ref 5–40)

## 2023-07-13 LAB — TSH: TSH: 0.579 u[IU]/mL (ref 0.450–4.500)

## 2023-07-13 LAB — HEPATITIS C ANTIBODY: Hep C Virus Ab: NONREACTIVE

## 2023-07-18 ENCOUNTER — Ambulatory Visit: Payer: Self-pay | Admitting: Family

## 2023-07-18 DIAGNOSIS — Z13220 Encounter for screening for lipoid disorders: Secondary | ICD-10-CM

## 2023-07-18 DIAGNOSIS — Z130 Encounter for screening for diseases of the blood and blood-forming organs and certain disorders involving the immune mechanism: Secondary | ICD-10-CM

## 2023-07-20 NOTE — Telephone Encounter (Signed)
 Patient called in for results. Patient identified. Patient was given lab results and had no questions

## 2024-03-22 ENCOUNTER — Encounter: Payer: Self-pay | Admitting: Family

## 2024-03-28 ENCOUNTER — Encounter: Payer: Self-pay | Admitting: Emergency Medicine

## 2024-03-28 ENCOUNTER — Inpatient Hospital Stay: Admission: RE | Admit: 2024-03-28 | Discharge: 2024-03-28 | Payer: Self-pay

## 2024-03-28 ENCOUNTER — Ambulatory Visit
Admission: RE | Admit: 2024-03-28 | Discharge: 2024-03-28 | Disposition: A | Discharge status: Home / Self Care | Payer: Self-pay | Source: Ambulatory Visit

## 2024-03-28 DIAGNOSIS — Z711 Person with feared health complaint in whom no diagnosis is made: Secondary | ICD-10-CM

## 2024-03-28 NOTE — ED Provider Notes (Signed)
 " Daniel Lester    CSN: [id number] Arrival date & time: 03/28/24  9147      History   Chief Complaint Chief Complaint  Patient presents with   SEXUALLY TRANSMITTED DISEASE    HPI Daniel Lester is a 29 y.o. male.   Pt request STD testing.  He is not currently having any concerns.  He denies any discharge.  Patient is requesting blood and swab  The history is provided by the patient. No language interpreter was used.    History reviewed. No pertinent past medical history.  There are no active problems to display for this patient.   History reviewed. No pertinent surgical history.     Home Medications    Prior to Admission medications  Medication Sig Start Date End Date Taking? Authorizing Provider  erythromycin  ophthalmic ointment Place a 1/2 inch ribbon of ointment into the lower eyelid at bedtime x7 days Patient not taking: Reported on 07/12/2023 10/31/20   Graham, Laura E, PA-C  polyethylene glycol powder (MIRALAX ) 17 GM/SCOOP powder Take 17 g by mouth in the morning and at bedtime. Patient not taking: Reported on 07/12/2023 02/20/21   Randol Simmonds, MD  sulfamethoxazole -trimethoprim  (BACTRIM  DS) 800-160 MG tablet Take 1 tablet by mouth 2 (two) times daily. Patient not taking: Reported on 07/12/2023 06/06/23   Arloa Suzen RAMAN, NP    Family History Family History  Problem Relation Age of Onset   Healthy Mother    Healthy Father    Diabetes Maternal Grandmother    Diabetes Maternal Grandfather     Social History Social History[1]   Allergies   Patient has no known allergies.   Review of Systems Review of Systems  All other systems reviewed and are negative.    Physical Exam Triage Vital Signs ED Triage Vitals [03/28/24 0900]  Encounter Vitals Group     BP 121/76     Girls Systolic BP Percentile      Girls Diastolic BP Percentile      Boys Systolic BP Percentile      Boys Diastolic BP Percentile      Pulse Rate 91     Resp 16     Temp  97.9 F (36.6 C)     Temp Source Oral     SpO2 98 %     Weight      Height      Head Circumference      Peak Flow      Pain Score 0     Pain Loc      Pain Education      Exclude from Growth Chart    No data found.  Updated Vital Signs BP 121/76 (BP Location: Left Arm)   Pulse 91   Temp 97.9 F (36.6 C) (Oral)   Resp 16   SpO2 98%   Visual Acuity Right Eye Distance:   Left Eye Distance:   Bilateral Distance:    Right Eye Near:   Left Eye Near:    Bilateral Near:     Physical Exam Vitals and nursing note reviewed.  Constitutional:      Appearance: He is well-developed.  HENT:     Head: Normocephalic.  Cardiovascular:     Rate and Rhythm: Normal rate.  Pulmonary:     Effort: Pulmonary effort is normal.  Abdominal:     General: There is no distension.  Musculoskeletal:        General: Normal range of motion.  Cervical back: Normal range of motion.  Skin:    General: Skin is warm.  Neurological:     General: No focal deficit present.     Mental Status: He is alert and oriented to person, place, and time.      UC Treatments / Results  Labs (all labs ordered are listed, but only abnormal results are displayed) Labs Reviewed - No data to display  EKG   Radiology No results found.  Procedures Procedures (including critical Lester time)  Medications Ordered in UC Medications - No data to display  Initial Impression / Assessment and Plan / UC Course  I have reviewed the triage vital signs and the nursing notes.  Pertinent labs & imaging results that were available during my Lester of the patient were reviewed by me and considered in my medical decision making (see chart for details).     GC chlamydia trichomonas RPR and HIV ordered Final Clinical Impressions(s) / UC Diagnoses   Final diagnoses:  Concern about STD in male without diagnosis   Discharge Instructions   None    ED Prescriptions   None    PDMP not reviewed this  encounter. Patient given information on safe sex.AVS        [1]  Social History Tobacco Use   Smoking status: Some Days    Types: Cigarettes    Passive exposure: Never   Smokeless tobacco: Never  Vaping Use   Vaping status: Never Used  Substance Use Topics   Alcohol use: Not Currently   Drug use: Yes    Types: Marijuana     Flint Sonny POUR, [place] 03/28/24 9061  "

## 2024-03-28 NOTE — ED Triage Notes (Signed)
 Patient presents for STI.  Patient wants to just make sure.  Patient denies any symptoms

## 2024-03-29 LAB — CYTOLOGY, (ORAL, ANAL, URETHRAL) ANCILLARY ONLY
Chlamydia: NEGATIVE
Comment: NEGATIVE
Comment: NEGATIVE
Comment: NORMAL
Neisseria Gonorrhea: NEGATIVE
Trichomonas: POSITIVE — AB

## 2024-03-29 LAB — HIV ANTIBODY (ROUTINE TESTING W REFLEX): HIV Screen 4th Generation wRfx: NONREACTIVE

## 2024-03-29 LAB — SYPHILIS: RPR W/REFLEX TO RPR TITER AND TREPONEMAL ANTIBODIES, TRADITIONAL SCREENING AND DIAGNOSIS ALGORITHM: RPR Ser Ql: NONREACTIVE

## 2024-07-12 ENCOUNTER — Encounter: Admitting: Family
